# Patient Record
Sex: Male | Born: 1965 | Race: White | Hispanic: No | Marital: Single | Smoking: Never smoker
Health system: Southern US, Community
[De-identification: ages and names within clinical notes are randomized; demographics above are authoritative.]

## PROBLEM LIST (undated history)

## (undated) DIAGNOSIS — I10 Essential (primary) hypertension: Secondary | ICD-10-CM

## (undated) HISTORY — PX: ORTHOPEDIC SURGERY: SHX850

## (undated) HISTORY — DX: Essential (primary) hypertension: I10

---

## 2020-05-16 ENCOUNTER — Emergency Department (HOSPITAL_COMMUNITY)

## 2020-05-16 ENCOUNTER — Emergency Department (HOSPITAL_COMMUNITY)
Admission: EM | Admit: 2020-05-16 | Discharge: 2020-05-16 | Disposition: A | Discharge status: Home / Self Care | Attending: Emergency Medicine | Admitting: Emergency Medicine

## 2020-05-16 ENCOUNTER — Encounter (HOSPITAL_COMMUNITY): Payer: Self-pay | Admitting: *Deleted

## 2020-05-16 ENCOUNTER — Other Ambulatory Visit: Payer: Self-pay

## 2020-05-16 DIAGNOSIS — R600 Localized edema: Secondary | ICD-10-CM | POA: Insufficient documentation

## 2020-05-16 DIAGNOSIS — R21 Rash and other nonspecific skin eruption: Secondary | ICD-10-CM | POA: Diagnosis not present

## 2020-05-16 DIAGNOSIS — I10 Essential (primary) hypertension: Secondary | ICD-10-CM | POA: Insufficient documentation

## 2020-05-16 DIAGNOSIS — R609 Edema, unspecified: Secondary | ICD-10-CM

## 2020-05-16 DIAGNOSIS — R0602 Shortness of breath: Secondary | ICD-10-CM | POA: Insufficient documentation

## 2020-05-16 DIAGNOSIS — L299 Pruritus, unspecified: Secondary | ICD-10-CM

## 2020-05-16 LAB — CBC WITH DIFFERENTIAL/PLATELET
Abs Immature Granulocytes: 0.12 10*3/uL — ABNORMAL HIGH (ref 0.00–0.07)
Basophils Absolute: 0.1 10*3/uL (ref 0.0–0.1)
Basophils Relative: 1 %
Eosinophils Absolute: 0.6 10*3/uL — ABNORMAL HIGH (ref 0.0–0.5)
Eosinophils Relative: 7 %
HCT: 40.1 % (ref 39.0–52.0)
Hemoglobin: 12.5 g/dL — ABNORMAL LOW (ref 13.0–17.0)
Immature Granulocytes: 1 %
Lymphocytes Relative: 16 %
Lymphs Abs: 1.5 10*3/uL (ref 0.7–4.0)
MCH: 27.9 pg (ref 26.0–34.0)
MCHC: 31.2 g/dL (ref 30.0–36.0)
MCV: 89.5 fL (ref 80.0–100.0)
Monocytes Absolute: 0.7 10*3/uL (ref 0.1–1.0)
Monocytes Relative: 8 %
Neutro Abs: 6.1 10*3/uL (ref 1.7–7.7)
Neutrophils Relative %: 67 %
Platelets: 237 10*3/uL (ref 150–400)
RBC: 4.48 MIL/uL (ref 4.22–5.81)
RDW: 13.6 % (ref 11.5–15.5)
WBC: 9.1 10*3/uL (ref 4.0–10.5)
nRBC: 0 % (ref 0.0–0.2)

## 2020-05-16 LAB — COMPREHENSIVE METABOLIC PANEL
ALT: 42 U/L (ref 0–44)
AST: 33 U/L (ref 15–41)
Albumin: 3.7 g/dL (ref 3.5–5.0)
Alkaline Phosphatase: 66 U/L (ref 38–126)
Anion gap: 10 (ref 5–15)
BUN: 13 mg/dL (ref 6–20)
CO2: 25 mmol/L (ref 22–32)
Calcium: 8.8 mg/dL — ABNORMAL LOW (ref 8.9–10.3)
Chloride: 105 mmol/L (ref 98–111)
Creatinine, Ser: 0.95 mg/dL (ref 0.61–1.24)
GFR calc Af Amer: >60 mL/min (ref >60)
GFR calc non Af Amer: >60 mL/min (ref >60)
Glucose, Bld: 119 mg/dL — ABNORMAL HIGH (ref 70–99)
Potassium: 3.5 mmol/L (ref 3.5–5.1)
Sodium: 140 mmol/L (ref 135–145)
Total Bilirubin: 0.4 mg/dL (ref 0.3–1.2)
Total Protein: 6.9 g/dL (ref 6.5–8.1)

## 2020-05-16 LAB — BRAIN NATRIURETIC PEPTIDE: B Natriuretic Peptide: 35 pg/mL (ref 0.0–100.0)

## 2020-05-16 MED ORDER — TRIAMCINOLONE ACETONIDE 0.1 % EX CREA
1.0000 | TOPICAL_CREAM | Freq: Two times a day (BID) | CUTANEOUS | 1 refills | Status: AC
Start: 2020-05-16 — End: ?

## 2020-05-16 MED ORDER — METHYLPREDNISOLONE 4 MG PO TBPK
ORAL_TABLET | ORAL | 0 refills | Status: DC
Start: 2020-05-16 — End: 2020-05-16

## 2020-05-16 MED ORDER — METHYLPREDNISOLONE 4 MG PO TBPK: ORAL_TABLET | ORAL | 0 refills | Status: AC

## 2020-05-16 MED ORDER — TRIAMCINOLONE ACETONIDE 0.1 % EX CREA
1.0000 | TOPICAL_CREAM | Freq: Two times a day (BID) | CUTANEOUS | 0 refills | Status: DC
Start: 2020-05-16 — End: 2020-05-16

## 2020-05-16 NOTE — ED Provider Notes (Signed)
[facility] EMERGENCY DEPARTMENT Provider Note   CSN: [id number] Arrival date & time: 05/16/20  1540     History Chief Complaint  Patient presents with  . Rash    Tommy Hicks is a 54 y.o. male.  Who presents emergency department for rash, shortness of breath and leg swelling.  Patient is currently incarcerated at the [facility].  He has a history of hypertension, obesity, and borderline diabetes as well as peripheral neuropathy.  Patient states that he has had about 1 month of peripheral edema which is new.  He states that he has had increasing and worsening exertional dyspnea but denies orthopnea or PND.  Patient also noticed a rash that began on his leg 1 week ago and has spread up his body into his upper extremities.  He describes it is very itchy and it is burning.  He is using the same soap that he is used for the last 11 years.  He denies any other changes in lotion.  He is unsure of what detergent is used jail.  He denies fevers, chills, chest pain.  He denies contacts with similar symptoms.  HPI     Past Medical History:  Diagnosis Date  . Hypertension     There are no problems to display for this patient.   Past Surgical History:  Procedure Laterality Date  . ORTHOPEDIC SURGERY         History reviewed. No pertinent family history.  Social History   Tobacco Use  . Smoking status: Never Smoker  . Smokeless tobacco: Never Used  Vaping Use  . Vaping Use: Never used  Substance Use Topics  . Alcohol use: Not Currently  . Drug use: Never    Home Medications Prior to Admission medications   Not on File    Allergies    Patient has no known allergies.  Review of Systems   Review of Systems Ten systems reviewed and are negative for acute change, except as noted in the HPI.   Physical Exam Updated Vital Signs BP (!) 140/106   Pulse 97   Temp 98.6 F (37 C)   Resp 20   Ht 5\' 5"  (1.651 m)   Wt (!) 113.9 kg   SpO2 98%   BMI 41.77 kg/m    Physical Exam Physical Exam  Nursing note and vitals reviewed. Constitutional: He appears well-developed and well-nourished. No distress.  HENT:  Head: Normocephalic and atraumatic.  Eyes: Conjunctivae normal are normal. No scleral icterus.  Neck: Normal range of motion. Neck supple.  Cardiovascular: Normal rate, regular rhythm and normal heart sounds.  Bilateral 2+ pitting edema, mild stasis dermatitis. Pulmonary/Chest: Effort normal and breath sounds normal. No respiratory distress.  Abdominal: Soft. There is no tenderness.  Musculoskeletal: He exhibits no edema.  Neurological: He is alert.  Skin: Skin is warm and dry. He is not diaphoretic.  Erythematous, papular rash with areas of confluence in the lower extremities and upper extremities.  Areas of excoriation noted Psychiatric: His behavior is normal.    ED Results / Procedures / Treatments   Labs (all labs ordered are listed, but only abnormal results are displayed) Labs Reviewed  COMPREHENSIVE METABOLIC PANEL  CBC WITH DIFFERENTIAL/PLATELET  BRAIN NATRIURETIC PEPTIDE    EKG None  Radiology No results found.  Procedures Procedures (including critical care time)  Medications Ordered in ED Medications - No data to display  ED Course  I have reviewed the triage vital signs and the nursing notes.  Pertinent  labs & imaging results that were available during my care of the patient were reviewed by me and considered in my medical decision making (see chart for details).    MDM Rules/Calculators/A&P                          CC:sob/rash VS: BP (!) 145/89   Pulse 74   Temp 98.6 F (37 C)   Resp 18   Ht 5\' 5"  (1.651 m)   Wt (!) 113.9 kg   SpO2 97%   BMI 41.77 kg/m  is gathered by patient and emr. Previous records obtained and reviewed. DDX:The patient's complaint of  Exertional sob involves an extensive number of diagnostic and treatment options, and is a complaint that carries with it a high risk  of complications, morbidity, and potential mortality. Given the large differential diagnosis, medical decision making is of high complexity. The emergent differential diagnosis for shortness of breath includes, but is not limited to, Pulmonary edema, bronchoconstriction, Pneumonia, Pulmonary embolism, Pneumotherax/ Hemothorax, Dysrythmia, ACS.  Labs: I ordered reviewed and interpreted labs which include CBC- mild normocytic anemia BMP- mildly elevated glucose BNP WNL  Imaging: I ordered and reviewed images which included cxr. I independently visualized and interpreted all imaging.There are no acute, significant findings on today's images. EKG: sinus rhythm at a rate of 85 Consults: YQ:MVHQION here with rash, peripheral edema.  No evidence of heart failure on examination.  Suspect venous stasis.  Patient does appear to have some amount of stasis dermatitis however he does have a rash as well.  I have low suspicion for scabies.  There are no interdigital lesions involvement of the groin.  I suspect this is more eczematous or allergic rash.  Patient be given a Medrol Dosepak and topical Kenalog.  He is advised to raise his legs up above his heart and wear compression stockings.  I have advised the patient that Medrol can increase his blood sugars and should he have increased urination, dry mouth, excessive thirst excessive hunger he should let his doctor at the present know.  Patient appears otherwise appropriate for discharge at this time.  Discussed return precautions. Patient disposition: Discharge The patient appears reasonably screened and/or stabilized for discharge and I doubt any other medical condition or other [facility] requiring further screening, evaluation, or treatment in the ED at this time prior to discharge. I have discussed lab and/or imaging findings with the patient and answered all questions/concerns to the best of my ability.I have discussed return precautions and OP follow up.    Final  Clinical Impression(s) / ED Diagnoses Final diagnoses:  None    Rx / DC Orders ED Discharge Orders    None       [facility], PA-C 05/16/20 1857    05/18/20, MD 05/19/20 1051

## 2020-05-16 NOTE — ED Triage Notes (Signed)
Rash over legs x 1 month, rash on arms and and between fingers

## 2020-05-16 NOTE — Discharge Instructions (Addendum)
Get help right away if you: Have a fever and your symptoms suddenly get worse. Develop confusion. Have a severe headache or a stiff neck. Have severe joint pains or stiffness. Have a seizure. Develop a rash that covers all or most of your body. The rash may or may not be painful. Develop blisters that: Are on top of the rash. Grow larger or grow together. Are painful. Are inside your nose or mouth. Develop a rash that: Looks like purple pinprick-sized spots all over your body. Has a "bull's eye" or looks like a target. Is not related to sun exposure, is red and painful, and causes your skin to peel. 

## 2020-05-16 NOTE — ED Notes (Signed)
Petechial rash to legs arms and hands  Has tried no benadryl  Nor OTC meds   Also states he is short of breath and has leg pain

## 2022-03-06 IMAGING — DX DG CHEST 1V PORT
1 series · 1 of 1 positions shown · non-contrast
Comparison: No pertinent prior exams are available for comparison.

CLINICAL DATA: Provided history: Shortness of breath. Additional
history provided: Rash over entire body for 1 month, shortness of
breath today, lower extremity swelling.

EXAM:
PORTABLE CHEST 1 VIEW

[chest ap]
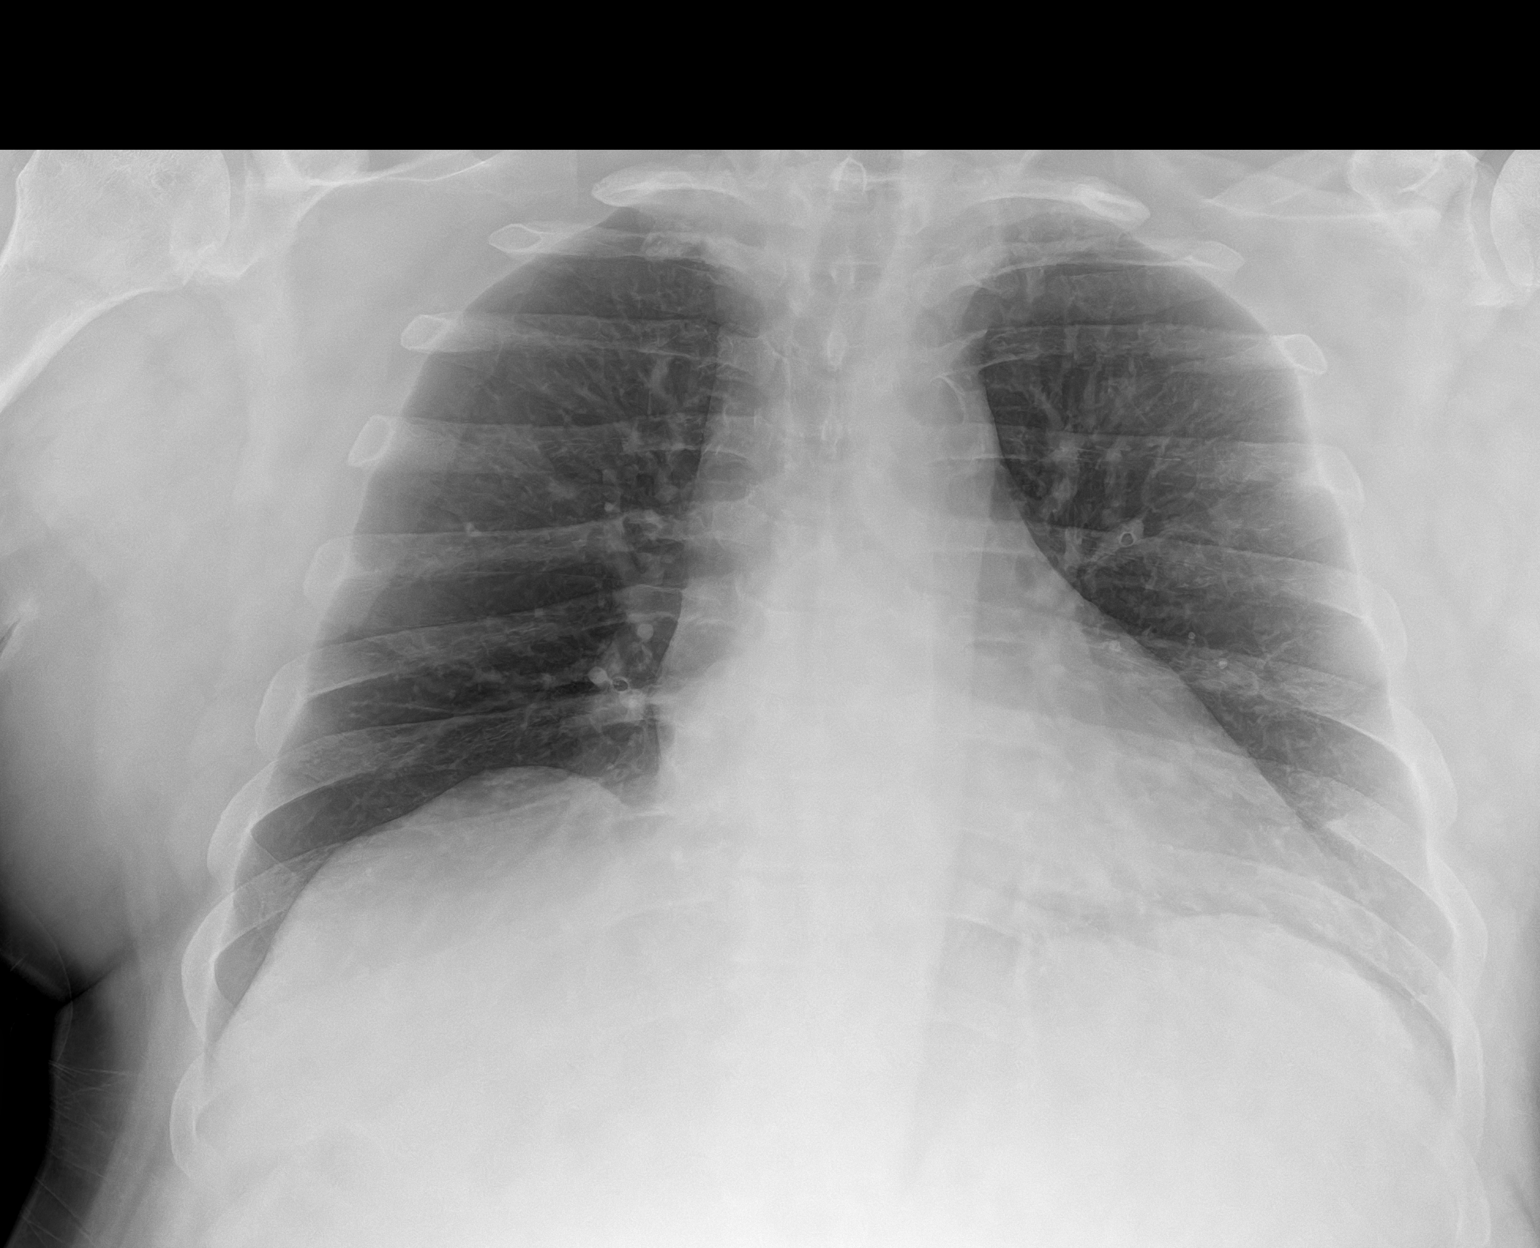

[1 of 1 positions shown; findings below may reference images not displayed]

FINDINGS: Heart size within normal limits.

There is no appreciable airspace consolidation.

No evidence of pleural effusion or pneumothorax.

No acute bony abnormality identified.
IMPRESSION: No evidence of active cardiopulmonary disease.
